# Patient Record
Sex: Male | Born: 1970 | Race: White | Hispanic: No | Marital: Married | Smoking: Former smoker
Health system: Southern US, Community
[De-identification: ages and names within clinical notes are randomized; demographics above are authoritative.]

## PROBLEM LIST (undated history)

## (undated) DIAGNOSIS — E039 Hypothyroidism, unspecified: Secondary | ICD-10-CM

## (undated) DIAGNOSIS — K219 Gastro-esophageal reflux disease without esophagitis: Secondary | ICD-10-CM

## (undated) DIAGNOSIS — E079 Disorder of thyroid, unspecified: Secondary | ICD-10-CM

## (undated) HISTORY — DX: Disorder of thyroid, unspecified: E07.9

## (undated) HISTORY — PX: WISDOM TOOTH EXTRACTION: SHX21

## (undated) HISTORY — DX: Gastro-esophageal reflux disease without esophagitis: K21.9

---

## 2002-02-15 ENCOUNTER — Encounter: Payer: Self-pay | Admitting: Internal Medicine

## 2002-02-15 ENCOUNTER — Encounter: Admission: RE | Admit: 2002-02-15 | Discharge: 2002-02-15 | Payer: Self-pay | Admitting: Internal Medicine

## 2002-02-22 ENCOUNTER — Encounter: Payer: Self-pay | Admitting: Internal Medicine

## 2002-02-22 ENCOUNTER — Ambulatory Visit (HOSPITAL_COMMUNITY): Admission: RE | Admit: 2002-02-22 | Discharge: 2002-02-22 | Payer: Self-pay | Admitting: Internal Medicine

## 2002-03-05 ENCOUNTER — Ambulatory Visit (HOSPITAL_COMMUNITY): Admission: RE | Admit: 2002-03-05 | Discharge: 2002-03-05 | Payer: Self-pay | Admitting: Internal Medicine

## 2002-03-05 ENCOUNTER — Encounter: Payer: Self-pay | Admitting: Internal Medicine

## 2005-01-14 HISTORY — PX: AMPUTATION: SHX166

## 2005-09-04 ENCOUNTER — Emergency Department (HOSPITAL_COMMUNITY): Admission: EM | Admit: 2005-09-04 | Discharge: 2005-09-04 | Payer: Self-pay | Admitting: Emergency Medicine

## 2007-06-26 ENCOUNTER — Encounter: Admission: RE | Admit: 2007-06-26 | Discharge: 2007-06-26 | Payer: Self-pay | Admitting: Internal Medicine

## 2007-06-27 ENCOUNTER — Encounter: Admission: RE | Admit: 2007-06-27 | Discharge: 2007-06-27 | Payer: Self-pay | Admitting: Internal Medicine

## 2009-05-25 IMAGING — CR DG ORBITS FOR FOREIGN BODY
2 series · 2 of 2 positions shown · non-contrast
Comparison: None

CLINICAL DATA: Metal exposure, pre MRI

ORBITS FOR FOREIGN BODY - 2 VIEW

[view not recorded (1 of 2)]
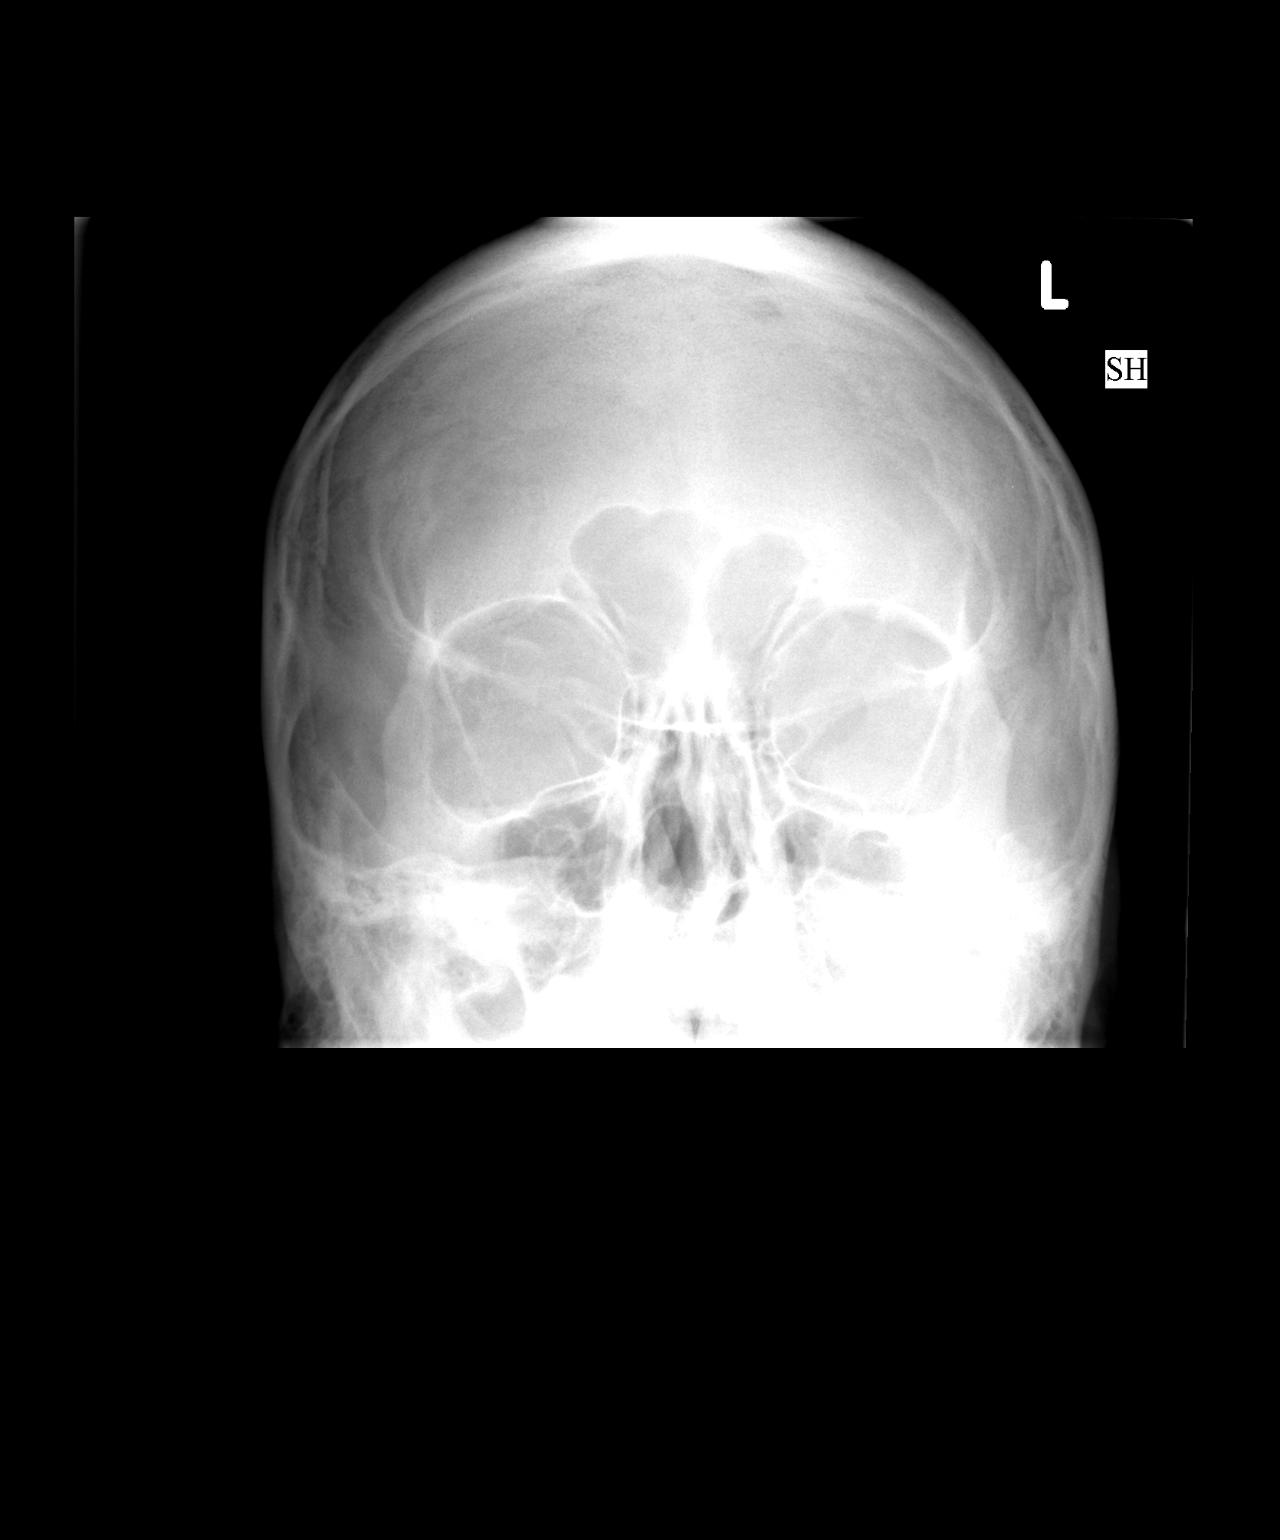

[view not recorded (2 of 2)]
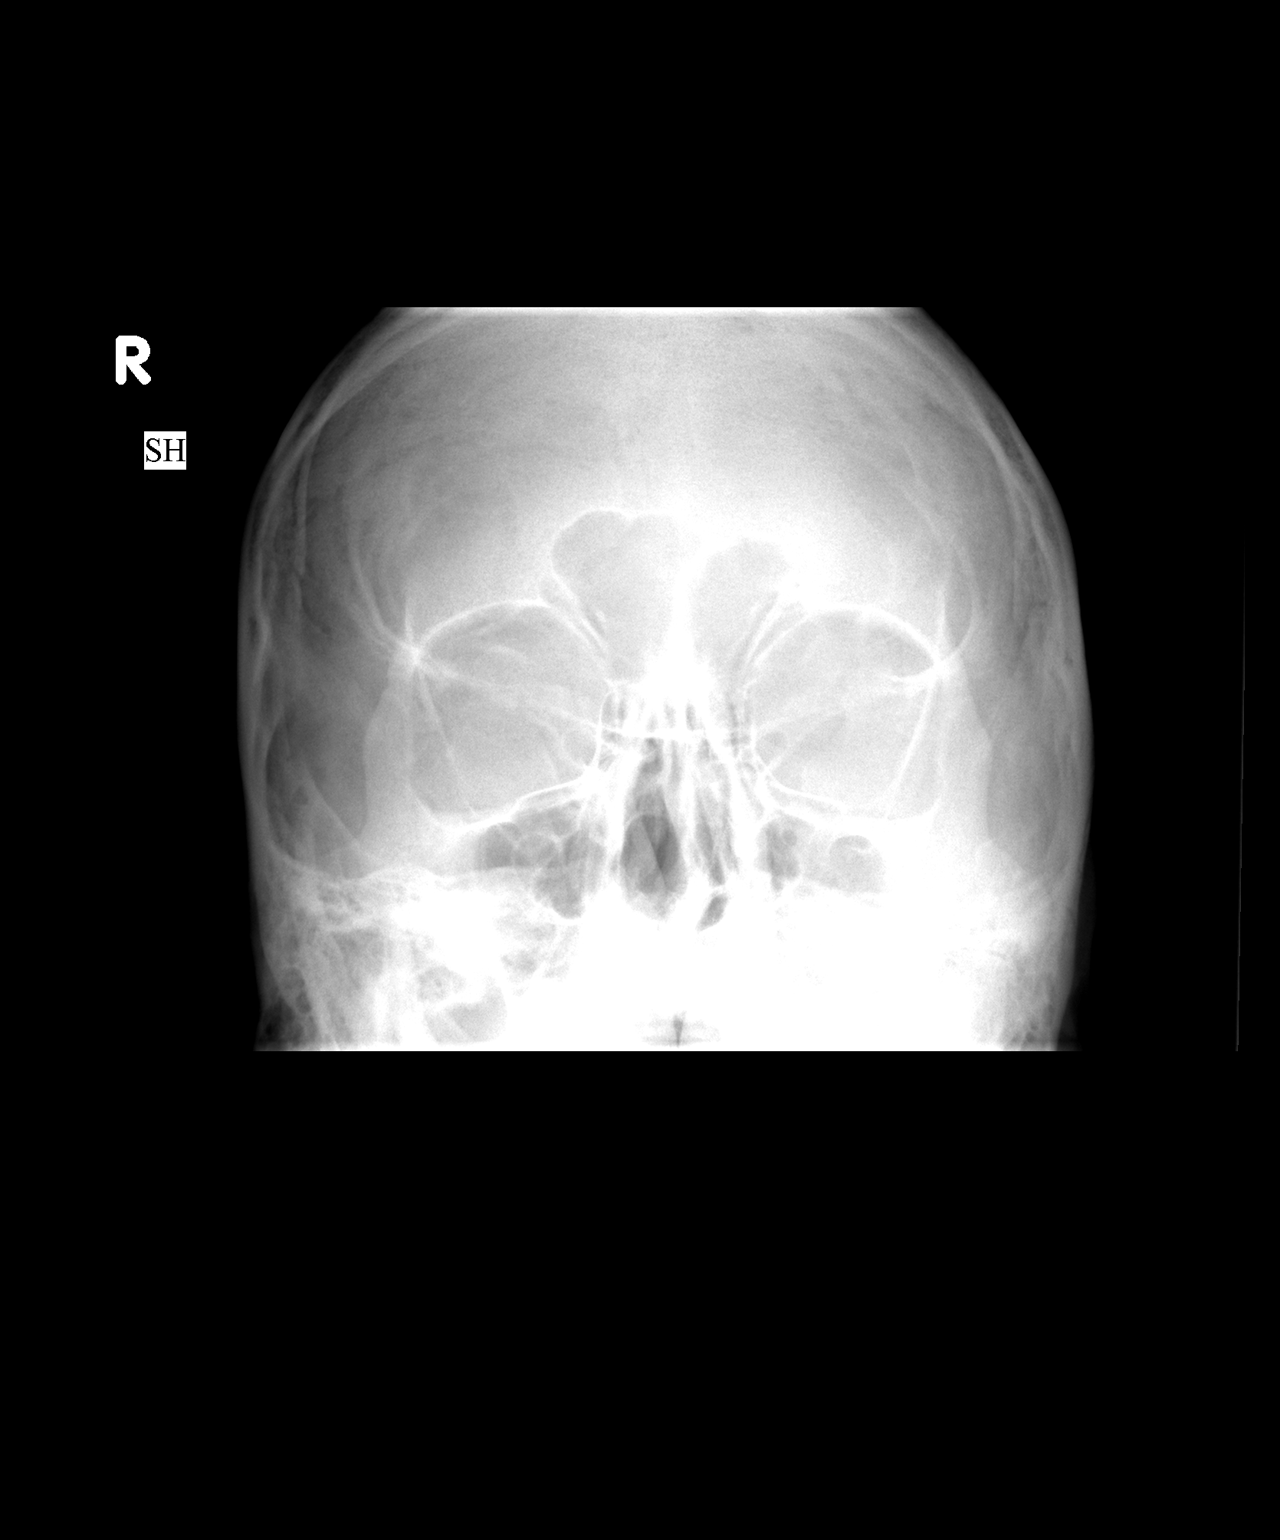

[2 of 2 positions shown; findings below may reference images not displayed]

FINDINGS: Views of the orbits were obtained with the patient
looking to the left and looking to the right.  No metallic orbital
foreign body is seen.  No bony abnormality is noted.
IMPRESSION: No orbital metallic foreign body.

## 2010-01-03 ENCOUNTER — Encounter
Admission: RE | Admit: 2010-01-03 | Discharge: 2010-01-03 | Payer: Self-pay | Source: Home / Self Care | Attending: Occupational Medicine | Admitting: Occupational Medicine

## 2010-06-01 NOTE — Consult Note (Signed)
Bradley Clark, Bradley Clark                 ACCOUNT NO.:  [id number]   MEDICAL RECORD NO.:  [id number]          PATIENT TYPE:  EMS   LOCATION:  [facility]                         FACILITY:  [facility]   PHYSICIAN:  Artist Pais. Weingold, M.D.DATE OF BIRTH:  01/11/1971   DATE OF CONSULTATION:  DATE OF DISCHARGE:  09/04/2005                                   CONSULTATION   REASON FOR CONSULTATION:  Bradley Clark is a 40 year old male, right hand  dominant, who unfortunately had a traumatic injury to his left long finger  that presented to the emergency room with a mutilating injury, with the  level of the middle phalanx, just distal to the proximal phalangeal joint.  He is 40 years old, he is 6 feet tall, he is 180 pounds.  __________swelling.  He has no known drug allergies.  He is not diabetic.  He __________ , currently taking medication, __________ surgery.  He denies  any significant drug or alcohol use.  Exam revealed a traumatic amputation.  The amputated part was significantly crushed.  There was signs of evulsion  of the neurovascular bundles both radially and digitally.  There is no sign  of any other injury.  X-ray showed fractures to the proximal aspect of the  middle phalanx.  He was given a digital block.  He was then prepped in the  usual sterile fashion.  In the emergency department, a revision amputation  with a V left flap was fashioned, including skeletal shortening.  This was  done under local anesthesia in the emergency room followed by 4-0 nylon  closure.  He was discharged from the emergency department with Percocet for  pain, Keflex for antibiotic prophylaxis.  He will follow up in my office in  5-7 days.      Artist Pais Mina Marble, M.D.  Electronically Signed     MAW/MEDQ  D:  10/28/2005  T:  10/29/2005  Job:  [id number]

## 2011-03-19 ENCOUNTER — Emergency Department (HOSPITAL_COMMUNITY)
Admission: EM | Admit: 2011-03-19 | Discharge: 2011-03-19 | Discharge status: Against Medical Advice | Payer: Self-pay | Attending: Emergency Medicine | Admitting: Emergency Medicine

## 2011-03-19 DIAGNOSIS — Z2089 Contact with and (suspected) exposure to other communicable diseases: Secondary | ICD-10-CM | POA: Insufficient documentation

## 2012-12-01 ENCOUNTER — Other Ambulatory Visit: Payer: Self-pay | Admitting: Endocrinology

## 2012-12-01 DIAGNOSIS — R1310 Dysphagia, unspecified: Secondary | ICD-10-CM

## 2012-12-02 ENCOUNTER — Encounter (INDEPENDENT_AMBULATORY_CARE_PROVIDER_SITE_OTHER): Payer: Self-pay | Admitting: Surgery

## 2012-12-03 ENCOUNTER — Ambulatory Visit
Admission: RE | Admit: 2012-12-03 | Discharge: 2012-12-03 | Disposition: A | Discharge status: Home / Self Care | Payer: 59 | Source: Ambulatory Visit | Attending: Endocrinology | Admitting: Endocrinology

## 2012-12-03 DIAGNOSIS — R1310 Dysphagia, unspecified: Secondary | ICD-10-CM

## 2012-12-14 ENCOUNTER — Ambulatory Visit (INDEPENDENT_AMBULATORY_CARE_PROVIDER_SITE_OTHER): Payer: 59 | Admitting: Surgery

## 2012-12-14 ENCOUNTER — Encounter (INDEPENDENT_AMBULATORY_CARE_PROVIDER_SITE_OTHER): Payer: Self-pay | Admitting: Surgery

## 2012-12-14 VITALS — BP 118/76 | HR 74 | Temp 97.2°F | Resp 14 | Ht 72.0 in | Wt 184.6 lb

## 2012-12-14 DIAGNOSIS — D1739 Benign lipomatous neoplasm of skin and subcutaneous tissue of other sites: Secondary | ICD-10-CM

## 2012-12-14 DIAGNOSIS — D171 Benign lipomatous neoplasm of skin and subcutaneous tissue of trunk: Secondary | ICD-10-CM

## 2012-12-14 NOTE — Progress Notes (Signed)
Patient ID: Bradley Clark, male   DOB: 06/11/1970, 42 y.o.   MRN: [id number]  Chief Complaint  Patient presents with  . New Evaluation    eval lipoma on back and buttocks    HPI Bradley Clark is a 42 y.o. male.  Referred by Dr. Dennis Clark for evaluation of subcutaneous masses of the lower back and thigh  HPI This is a healthy 42-year-old male who presents with several months of palpable masses in his lower back causing some discomfort. These have been enlarging slightly. They have never become inflamed or infected. They do cause some chronic discomfort in this area. Over the last few weeks he has developed another one of these masses in the posterior upper medial thigh near his buttocks. This has become fairly tender because he sits directly on this area. He is now referred for surgical evaluation.  Past Medical History  Diagnosis Date  . GERD (gastroesophageal reflux disease)   . Thyroid disease     History reviewed. No pertinent past surgical history.  History reviewed. No pertinent family history.  Social History History  Substance Use Topics  . Smoking status: Former Smoker    Types: Cigarettes    Quit date: 12/14/1997  . Smokeless tobacco: Former User  . 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  No Known Allergies  Current Outpatient Prescriptions  Medication Sig Dispense Refill  . levothyroxine (SYNTHROID, LEVOTHROID) 175 MCG tablet Take 175 mcg by mouth daily before breakfast.       No current facility-administered medications for this visit.    Review of Systems Review of Systems  Constitutional: Negative for fever, chills and unexpected weight change.  HENT: Negative for congestion, hearing loss, sore throat, trouble swallowing and voice change.   Eyes: Negative for visual disturbance.  Respiratory: Negative for cough and wheezing.   Cardiovascular: Negative for chest pain, palpitations and leg swelling.  Gastrointestinal: Negative for  nausea, vomiting, abdominal pain, diarrhea, constipation, blood in stool, abdominal distention, anal bleeding and rectal pain.  Genitourinary: Negative for hematuria and difficulty urinating.  Musculoskeletal: Negative for arthralgias.  Skin: Negative for rash and wound.  Neurological: Negative for seizures, syncope, weakness and headaches.  Hematological: Negative for adenopathy. Does not bruise/bleed easily.  Psychiatric/Behavioral: Negative for confusion.    Blood pressure 118/76, pulse 74, temperature 97.2 F (36.2 C), temperature source Temporal, resp. rate 14, height 6' (1.829 m), weight 184 lb 9.6 oz (83.734 kg).  Physical Exam Physical Exam Well-developed well-nourished male in no apparent distress The lower back shows no visible skin changes. On deep palpation in the lumbar region just over the paraspinal muscles there are 2 palpable masses on the right and 1 palpable mass on the left. Each of these seems to be smooth and oval shaped, measuring about 2 cm across.  On the posterior upper left thigh medially, there is a 2 cm firm smooth palpable mass. 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 There is 1 on the posterior left thigh. Each of these measures about 2 cm.     Plan    Excision of all of these under anesthesia for biopsy. He seemed to be enlarging rather quickly and causing some discomfort.The surgical procedure has been discussed with the patient.  Potential risks, benefits, alternative treatments, and expected outcomes have been   explained.  All of the patient's questions at this time have been answered.  The likelihood of reaching the patient's treatment goal is good.  The patient understand the proposed surgical procedure and wishes to proceed.         Bradley Clark K. 12/14/2012, 10:06 AM    

## 2012-12-21 ENCOUNTER — Encounter (HOSPITAL_BASED_OUTPATIENT_CLINIC_OR_DEPARTMENT_OTHER): Payer: Self-pay | Admitting: *Deleted

## 2012-12-21 NOTE — Progress Notes (Signed)
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

## 2012-12-24 ENCOUNTER — Ambulatory Visit (HOSPITAL_BASED_OUTPATIENT_CLINIC_OR_DEPARTMENT_OTHER)
Admission: RE | Admit: 2012-12-24 | Discharge: 2012-12-24 | Disposition: A | Discharge status: Home / Self Care | Payer: 59 | Source: Ambulatory Visit | Attending: Surgery | Admitting: Surgery

## 2012-12-24 ENCOUNTER — Encounter (HOSPITAL_BASED_OUTPATIENT_CLINIC_OR_DEPARTMENT_OTHER): Payer: Self-pay | Admitting: *Deleted

## 2012-12-24 ENCOUNTER — Ambulatory Visit (HOSPITAL_BASED_OUTPATIENT_CLINIC_OR_DEPARTMENT_OTHER): Payer: 59 | Admitting: Anesthesiology

## 2012-12-24 ENCOUNTER — Encounter (HOSPITAL_BASED_OUTPATIENT_CLINIC_OR_DEPARTMENT_OTHER)
Admission: RE | Disposition: A | Discharge status: Home / Self Care | Payer: Self-pay | Source: Ambulatory Visit | Attending: Surgery

## 2012-12-24 ENCOUNTER — Encounter (HOSPITAL_BASED_OUTPATIENT_CLINIC_OR_DEPARTMENT_OTHER): Payer: 59 | Admitting: Anesthesiology

## 2012-12-24 DIAGNOSIS — D1739 Benign lipomatous neoplasm of skin and subcutaneous tissue of other sites: Secondary | ICD-10-CM

## 2012-12-24 DIAGNOSIS — E079 Disorder of thyroid, unspecified: Secondary | ICD-10-CM | POA: Insufficient documentation

## 2012-12-24 DIAGNOSIS — Z79899 Other long term (current) drug therapy: Secondary | ICD-10-CM | POA: Insufficient documentation

## 2012-12-24 DIAGNOSIS — D171 Benign lipomatous neoplasm of skin and subcutaneous tissue of trunk: Secondary | ICD-10-CM

## 2012-12-24 DIAGNOSIS — Z87891 Personal history of nicotine dependence: Secondary | ICD-10-CM | POA: Insufficient documentation

## 2012-12-24 DIAGNOSIS — K219 Gastro-esophageal reflux disease without esophagitis: Secondary | ICD-10-CM | POA: Insufficient documentation

## 2012-12-24 HISTORY — DX: Hypothyroidism, unspecified: E03.9

## 2012-12-24 HISTORY — PX: LIPOMA EXCISION: SHX5283

## 2012-12-24 SURGERY — EXCISION LIPOMA
Anesthesia: General

## 2012-12-24 MED ORDER — FENTANYL CITRATE 0.05 MG/ML IJ SOLN
INTRAMUSCULAR | Status: DC | PRN
  Administered 2012-12-24: 100 ug via INTRAVENOUS

## 2012-12-24 MED ORDER — DEXAMETHASONE SODIUM PHOSPHATE 4 MG/ML IJ SOLN
INTRAMUSCULAR | Status: DC | PRN
  Administered 2012-12-24: 10 mg via INTRAVENOUS

## 2012-12-24 MED ORDER — CEFAZOLIN SODIUM-DEXTROSE 2-3 GM-% IV SOLR
INTRAVENOUS | Status: AC
  Filled 2012-12-24: qty 50

## 2012-12-24 MED ORDER — CHLORHEXIDINE GLUCONATE 4 % EX LIQD: 1.0000 "application " | Freq: Once | CUTANEOUS | Status: DC

## 2012-12-24 MED ORDER — MIDAZOLAM HCL 2 MG/2ML IJ SOLN
INTRAMUSCULAR | Status: AC
  Filled 2012-12-24: qty 2

## 2012-12-24 MED ORDER — ONDANSETRON HCL 4 MG/2ML IJ SOLN
INTRAMUSCULAR | Status: DC | PRN
  Administered 2012-12-24: 4 mg via INTRAVENOUS

## 2012-12-24 MED ORDER — OXYCODONE HCL 5 MG/5ML PO SOLN: 5.0000 mg | Freq: Once | ORAL | Status: DC | PRN

## 2012-12-24 MED ORDER — HYDROCODONE-ACETAMINOPHEN 5-325 MG PO TABS: 1.0000 | ORAL_TABLET | ORAL | Status: AC | PRN

## 2012-12-24 MED ORDER — LIDOCAINE HCL (CARDIAC) 20 MG/ML IV SOLN
INTRAVENOUS | Status: DC | PRN
  Administered 2012-12-24: 100 mg via INTRAVENOUS

## 2012-12-24 MED ORDER — CEFAZOLIN SODIUM-DEXTROSE 2-3 GM-% IV SOLR: 2.0000 g | INTRAVENOUS | Status: DC

## 2012-12-24 MED ORDER — ONDANSETRON HCL 4 MG/2ML IJ SOLN: 4.0000 mg | INTRAMUSCULAR | Status: DC | PRN

## 2012-12-24 MED ORDER — SUCCINYLCHOLINE CHLORIDE 20 MG/ML IJ SOLN
INTRAMUSCULAR | Status: DC | PRN
  Administered 2012-12-24: 100 mg via INTRAVENOUS

## 2012-12-24 MED ORDER — MIDAZOLAM HCL 5 MG/5ML IJ SOLN
INTRAMUSCULAR | Status: DC | PRN
  Administered 2012-12-24: 2 mg via INTRAVENOUS

## 2012-12-24 MED ORDER — OXYCODONE HCL 5 MG PO TABS: 5.0000 mg | ORAL_TABLET | Freq: Once | ORAL | Status: DC | PRN

## 2012-12-24 MED ORDER — BUPIVACAINE-EPINEPHRINE 0.25% -1:200000 IJ SOLN
INTRAMUSCULAR | Status: DC | PRN
  Administered 2012-12-24: 12.5 mL

## 2012-12-24 MED ORDER — MORPHINE SULFATE 2 MG/ML IJ SOLN: 2.0000 mg | INTRAMUSCULAR | Status: DC | PRN

## 2012-12-24 MED ORDER — HYDROMORPHONE HCL PF 1 MG/ML IJ SOLN: 0.2500 mg | INTRAMUSCULAR | Status: DC | PRN

## 2012-12-24 MED ORDER — FENTANYL CITRATE 0.05 MG/ML IJ SOLN
INTRAMUSCULAR | Status: AC
  Filled 2012-12-24: qty 6

## 2012-12-24 MED ORDER — METOCLOPRAMIDE HCL 5 MG/ML IJ SOLN: 10.0000 mg | Freq: Once | INTRAMUSCULAR | Status: DC | PRN

## 2012-12-24 MED ORDER — LACTATED RINGERS IV SOLN
INTRAVENOUS | Status: DC
  Administered 2012-12-24: 12:00:00 via INTRAVENOUS

## 2012-12-24 MED ORDER — FENTANYL CITRATE 0.05 MG/ML IJ SOLN: 50.0000 ug | INTRAMUSCULAR | Status: DC | PRN

## 2012-12-24 MED ORDER — HYDROCODONE-ACETAMINOPHEN 5-325 MG PO TABS: 1.0000 | ORAL_TABLET | ORAL | Status: DC | PRN

## 2012-12-24 MED ORDER — KETOROLAC TROMETHAMINE 30 MG/ML IJ SOLN
INTRAMUSCULAR | Status: DC | PRN
  Administered 2012-12-24: 30 mg via INTRAVENOUS

## 2012-12-24 MED ORDER — PROPOFOL 10 MG/ML IV BOLUS
INTRAVENOUS | Status: DC | PRN
  Administered 2012-12-24: 300 mg via INTRAVENOUS

## 2012-12-24 MED ORDER — BUPIVACAINE-EPINEPHRINE PF 0.25-1:200000 % IJ SOLN
INTRAMUSCULAR | Status: AC
  Filled 2012-12-24: qty 30

## 2012-12-24 MED ORDER — MIDAZOLAM HCL 2 MG/2ML IJ SOLN: 1.0000 mg | INTRAMUSCULAR | Status: DC | PRN

## 2012-12-24 SURGICAL SUPPLY — 41 items
APL SKNCLS STERI-STRIP NONHPOA (GAUZE/BANDAGES/DRESSINGS) ×2
BENZOIN TINCTURE PRP APPL 2/3 (GAUZE/BANDAGES/DRESSINGS) ×4 IMPLANT
BLADE SURG 15 STRL LF DISP TIS (BLADE) ×2 IMPLANT
BLADE SURG 15 STRL SS (BLADE) ×4
BLADE SURG ROTATE 9660 (MISCELLANEOUS) ×2 IMPLANT
CANISTER SUCT 1200ML W/VALVE (MISCELLANEOUS) IMPLANT
CHLORAPREP W/TINT 26ML (MISCELLANEOUS) ×4 IMPLANT
COVER MAYO STAND STRL (DRAPES) ×2 IMPLANT
COVER TABLE BACK 60X90 (DRAPES) ×2 IMPLANT
DECANTER SPIKE VIAL GLASS SM (MISCELLANEOUS) IMPLANT
DRAPE PED LAPAROTOMY (DRAPES) ×2 IMPLANT
DRAPE UTILITY XL STRL (DRAPES) ×2 IMPLANT
DRSG TEGADERM 4X4.75 (GAUZE/BANDAGES/DRESSINGS) ×8 IMPLANT
ELECT COATED BLADE 2.86 ST (ELECTRODE) ×2 IMPLANT
ELECT REM PT RETURN 9FT ADLT (ELECTROSURGICAL) ×2
ELECTRODE REM PT RTRN 9FT ADLT (ELECTROSURGICAL) ×1 IMPLANT
GAUZE SPONGE 4X4 12PLY STRL LF (GAUZE/BANDAGES/DRESSINGS) IMPLANT
GLOVE BIO SURGEON STRL SZ7 (GLOVE) ×2 IMPLANT
GLOVE BIOGEL PI IND STRL 7.0 (GLOVE) ×1 IMPLANT
GLOVE BIOGEL PI IND STRL 7.5 (GLOVE) ×1 IMPLANT
GLOVE BIOGEL PI INDICATOR 7.0 (GLOVE) ×1
GLOVE BIOGEL PI INDICATOR 7.5 (GLOVE) ×1
GLOVE SURG SS PI 7.0 STRL IVOR (GLOVE) ×2 IMPLANT
GOWN PREVENTION PLUS XLARGE (GOWN DISPOSABLE) ×4 IMPLANT
NEEDLE HYPO 25X1 1.5 SAFETY (NEEDLE) ×2 IMPLANT
NS IRRIG 1000ML POUR BTL (IV SOLUTION) IMPLANT
PACK BASIN DAY SURGERY FS (CUSTOM PROCEDURE TRAY) ×2 IMPLANT
PENCIL BUTTON HOLSTER BLD 10FT (ELECTRODE) ×2 IMPLANT
SPONGE GAUZE 2X2 8PLY STRL LF (GAUZE/BANDAGES/DRESSINGS) ×6 IMPLANT
STRIP CLOSURE SKIN 1/2X4 (GAUZE/BANDAGES/DRESSINGS) ×2 IMPLANT
SUT MON AB 4-0 PC3 18 (SUTURE) ×4 IMPLANT
SUT PROLENE 6 0 P 1 18 (SUTURE) IMPLANT
SUT SILK 2 0 FS (SUTURE) IMPLANT
SUT VIC AB 3-0 SH 27 (SUTURE) ×4
SUT VIC AB 3-0 SH 27X BRD (SUTURE) ×2 IMPLANT
SUT VICRYL 3-0 CR8 SH (SUTURE) IMPLANT
SYR CONTROL 10ML LL (SYRINGE) ×2 IMPLANT
TOWEL OR 17X24 6PK STRL BLUE (TOWEL DISPOSABLE) ×2 IMPLANT
TOWEL OR NON WOVEN STRL DISP B (DISPOSABLE) ×2 IMPLANT
TUBE CONNECTING 20X1/4 (TUBING) IMPLANT
YANKAUER SUCT BULB TIP NO VENT (SUCTIONS) IMPLANT

## 2012-12-24 NOTE — Anesthesia Postprocedure Evaluation (Signed)
Anesthesia Post Note  Patient: Bradley Clark  Procedure(s) Performed: Procedure(s) (LRB): EXCISION MULTIPLE LIPOMAS LOWER BACK AND POSTERIOR LEFT THIGH (N/A)  Anesthesia type: General  Patient location: PACU  Post pain: Pain level controlled  Post assessment: Patient's Cardiovascular Status Stable  Last Vitals:  Filed Vitals:   12/24/12 1430  BP: 114/79  Pulse: 84  Temp:   Resp: 13    Post vital signs: Reviewed and stable  Level of consciousness: alert  Complications: No apparent anesthesia complications

## 2012-12-24 NOTE — Anesthesia Procedure Notes (Signed)
Procedure Name: Intubation Date/Time: 12/24/2012 12:47 PM Performed by: Zenia Resides D Pre-anesthesia Checklist: Patient identified, Emergency Drugs available, Suction available and Patient being monitored Patient Re-evaluated:Patient Re-evaluated prior to inductionOxygen Delivery Method: Circle System Utilized Preoxygenation: Pre-oxygenation with 100% oxygen Intubation Type: IV induction Ventilation: Mask ventilation without difficulty Laryngoscope Size: Mac and 3 Grade View: Grade I Tube type: Oral Number of attempts: 1 Airway Equipment and Method: stylet and oral airway Placement Confirmation: ETT inserted through vocal cords under direct vision,  positive ETCO2 and breath sounds checked- equal and bilateral Secured at: 23 cm Tube secured with: Tape Dental Injury: Teeth and Oropharynx as per pre-operative assessment

## 2012-12-24 NOTE — Interval H&P Note (Signed)
History and Physical Interval Note:  12/24/2012 12:20 PM  Bradley Clark  has presented today for surgery, with the diagnosis of mulitiple subcutaneous lipomas  The various methods of treatment have been discussed with the patient and family. After consideration of risks, benefits and other options for treatment, the patient has consented to  Procedure(s): EXCISION MULTIPLE LIPOMAS LOWER BACK AND POSTERIOR LEFT THIGH (N/A) as a surgical intervention .  The patient's history has been reviewed, patient examined, no change in status, stable for surgery.  I have reviewed the patient's chart and labs.  Questions were answered to the patient's satisfaction.     Vaiden Adames K.

## 2012-12-24 NOTE — Transfer of Care (Signed)
Immediate Anesthesia Transfer of Care Note  Patient: Bradley Clark  Procedure(s) Performed: Procedure(s): EXCISION MULTIPLE LIPOMAS LOWER BACK AND POSTERIOR LEFT THIGH (N/A)  Patient Location: PACU  Anesthesia Type:General  Level of Consciousness: awake, alert  and oriented  Airway & Oxygen Therapy: Patient Spontanous Breathing and Patient connected to face mask oxygen  Post-op Assessment: Report given to PACU RN and Post -op Vital signs reviewed and stable  Post vital signs: Reviewed and stable  Complications: No apparent anesthesia complications

## 2012-12-24 NOTE — Anesthesia Preprocedure Evaluation (Signed)
Anesthesia Evaluation  Patient identified by MRN, date of birth, ID band Patient awake    Reviewed: Allergy & Precautions, H&P , NPO status , Patient's Chart, lab work & pertinent test results, reviewed documented beta blocker date and time   Airway Mallampati: II TM Distance: >3 FB Neck ROM: full    Dental   Pulmonary neg pulmonary ROS, former smoker,  breath sounds clear to auscultation        Cardiovascular negative cardio ROS  Rhythm:regular     Neuro/Psych negative neurological ROS  negative psych ROS   GI/Hepatic Neg liver ROS, GERD-  Medicated and Controlled,  Endo/Other  Hypothyroidism   Renal/GU negative Renal ROS  negative genitourinary   Musculoskeletal   Abdominal   Peds  Hematology negative hematology ROS (+)   Anesthesia Other Findings See surgeon's H&P   Reproductive/Obstetrics negative OB ROS                           Anesthesia Physical Anesthesia Plan  ASA: II  Anesthesia Plan: General   Post-op Pain Management:    Induction: Intravenous  Airway Management Planned: Oral ETT  Additional Equipment:   Intra-op Plan:   Post-operative Plan: Extubation in OR  Informed Consent: I have reviewed the patients History and Physical, chart, labs and discussed the procedure including the risks, benefits and alternatives for the proposed anesthesia with the patient or authorized representative who has indicated his/her understanding and acceptance.   Dental Advisory Given  Plan Discussed with: CRNA and Surgeon  Anesthesia Plan Comments:         Anesthesia Quick Evaluation

## 2012-12-24 NOTE — Op Note (Signed)
Pre-op Diagnosis:  Subcutaneous masses - lumbar region x 4 and left posterior thigh Post-op Diagnosis:  Same Procedure performed: Excision of subcutaneous masses x5 from the lumbar region and posterior left thigh  (all measured 2 cm or less each) Surgeon:Leonte Horrigan K. Anesthesia: Gen.  Indications: This is a 42 year old male who presents with some lower back pain as well as pain in his left upper thigh. He is palpated some enlarging firm subcutaneous nodules in this area. It is unclear whether these nodules are related to his pain but they seem to be enlarging. He was examined in the office and these were felt to represent lipomas. I offered to excise to see if this relieves his symptoms.  Description of procedure:The patient brought to the operating room and placed in a supine position on the stretcher. After an adequate level of general anesthesia was obtained, he was flipped to a prone position on the table. We shaved and prepped his lower back and draped in sterile fashion. A timeout was taken to ensure the proper patient proper procedure. The patient is to palpable masses on the left and to palpable masses on the right. I made transverse incisions between the 2 masses on the left. We dissected down into the subcutaneous tissues with cautery. I can palpate these for masses and excised these entirely. These seemed to be firm lipomas but this was sent for pathologic examination. The wound was infiltrated with 0.25% Marcaine with epinephrine.We inspected for hemostasis. We then closed with a deep layer of 3-0 Vicryl and a subcuticular layer of 4-0 Monocryl.  We made a similar transverse incision on the right. We dissected down the subcutaneous tissues with cautery and excised 2 more palpable masses. This was sent for pathologic examination. We closed with 3-0 Vicryl and 4-0 Monocryl. Steri-Strips and clean dressings were applied.  We then took down her sterile drapes and reprepped his left posterior  thigh. We infiltrated with 0.25% Marcaine. I made a small transverse incision over this area. We excised the firm nodule in the subcutaneous fat. This was sent for pathologic examination. The wound is closed with 3-0 Vicryl 4 Monocryl and dressed in similar fashion.  The patient was flipped back onto his back and was extubated. He is brought to recovery in stable condition. All sponge, initially, and needle counts are correct.  Wilmon Arms. Corliss Skains, MD, [facility]  General/ Trauma Surgery  12/24/2012 1:59 PM

## 2012-12-24 NOTE — H&P (View-Only) (Signed)
Patient ID: Bradley Clark, male   DOB: [birth year], 42 y.o.   MRN: [id number]  Chief Complaint  Patient presents with  . New Evaluation    eval lipoma on back and buttocks    HPI Bradley Clark is a 42 y.o. male.  Referred by Dr. Adela Lank for evaluation of subcutaneous masses of the lower back and thigh  HPI This is a healthy 42 year old male who presents with several months of palpable masses in his lower back causing some discomfort. These have been enlarging slightly. They have never become inflamed or infected. They do cause some chronic discomfort in this area. Over the last few weeks he has developed another one of these masses in the posterior upper medial thigh near his buttocks. This has become fairly tender because he sits directly on this area. He is now referred for surgical evaluation.  Past Medical History  Diagnosis Date  . GERD (gastroesophageal reflux disease)   . Thyroid disease     History reviewed. No pertinent past surgical history.  History reviewed. No pertinent family history.  Social History History  Substance Use Topics  . Smoking status: Former Smoker    Types: Cigarettes    Quit date: 12/14/1997  . Smokeless tobacco: Former Neurosurgeon  . Alcohol Use: No  The patient works as a Medical laboratory scientific officer.  No Known Allergies  Current Outpatient Prescriptions  Medication Sig Dispense Refill  . levothyroxine (SYNTHROID, LEVOTHROID) 175 MCG tablet Take 175 mcg by mouth daily before breakfast.       No current facility-administered medications for this visit.    Review of Systems Review of Systems  Constitutional: Negative for fever, chills and unexpected weight change.  HENT: Negative for congestion, hearing loss, sore throat, trouble swallowing and voice change.   Eyes: Negative for visual disturbance.  Respiratory: Negative for cough and wheezing.   Cardiovascular: Negative for chest pain, palpitations and leg swelling.  Gastrointestinal: Negative for  nausea, vomiting, abdominal pain, diarrhea, constipation, blood in stool, abdominal distention, anal bleeding and rectal pain.  Genitourinary: Negative for hematuria and difficulty urinating.  Musculoskeletal: Negative for arthralgias.  Skin: Negative for rash and wound.  Neurological: Negative for seizures, syncope, weakness and headaches.  Hematological: Negative for adenopathy. Does not bruise/bleed easily.  Psychiatric/Behavioral: Negative for confusion.    Blood pressure 118/76, pulse 74, temperature 97.2 F (36.2 C), temperature source Temporal, resp. rate 14, height 6' (1.829 m), weight 184 lb 9.6 oz (83.734 kg).  Physical Exam Physical Exam Well-developed well-nourished male in no apparent distress The lower back shows no visible skin changes. On deep palpation in the lumbar region just over the paraspinal muscles there are 2 palpable masses on the right and 1 palpable mass on the left. Each of these seems to be smooth and oval shaped, measuring about 2 cm across.  On the posterior upper left thigh medially, there is a 2 cm firm smooth palpable mass. 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 There is 1 on the posterior left thigh. Each of these measures about 2 cm.     Plan    Excision of all of these under anesthesia for biopsy. He seemed to be enlarging rather quickly and causing some discomfort.The surgical procedure has been discussed with the patient.  Potential risks, benefits, alternative treatments, and expected outcomes have been  explained.  All of the patient's questions at this time have been answered.  The likelihood of reaching the patient's treatment goal is good.  The patient understand the proposed surgical procedure and wishes to proceed.         Torell Minder K. 12/14/2012, 10:06 AM

## 2012-12-25 ENCOUNTER — Encounter (HOSPITAL_BASED_OUTPATIENT_CLINIC_OR_DEPARTMENT_OTHER): Payer: Self-pay | Admitting: Surgery

## 2012-12-25 ENCOUNTER — Telehealth (INDEPENDENT_AMBULATORY_CARE_PROVIDER_SITE_OTHER): Payer: Self-pay | Admitting: General Surgery

## 2012-12-25 NOTE — Telephone Encounter (Signed)
Called patient to let him know that his lipoma are normal no sign of cancer and to keep his apt with Dr Corliss Skains on 01-18-13

## 2012-12-28 ENCOUNTER — Encounter (INDEPENDENT_AMBULATORY_CARE_PROVIDER_SITE_OTHER): Payer: Self-pay | Admitting: General Surgery

## 2013-01-18 ENCOUNTER — Encounter (INDEPENDENT_AMBULATORY_CARE_PROVIDER_SITE_OTHER): Payer: Self-pay | Admitting: Surgery

## 2013-01-18 ENCOUNTER — Ambulatory Visit (INDEPENDENT_AMBULATORY_CARE_PROVIDER_SITE_OTHER): Payer: 59 | Admitting: Surgery

## 2013-01-18 VITALS — BP 124/62 | HR 62 | Temp 98.0°F | Resp 18 | Ht 72.0 in | Wt 183.0 lb

## 2013-01-18 DIAGNOSIS — D1739 Benign lipomatous neoplasm of skin and subcutaneous tissue of other sites: Secondary | ICD-10-CM

## 2013-01-18 DIAGNOSIS — D171 Benign lipomatous neoplasm of skin and subcutaneous tissue of trunk: Secondary | ICD-10-CM

## 2013-01-18 NOTE — Progress Notes (Signed)
The patient returns after excision of multiple subcutaneous lipomas from his lumbar region. This was performed on 12/24/12. Pathology confirmed that these were benign lipomas. The patient's back feels much better. He is no longer having muscle pain that may have been caused by pressure from his lipomas.  His incisions are well-healed with no sign of infection or seroma.  He may resume full activity.  Followup as needed.  Imogene Burn. Georgette Dover, MD, [facility]  General/ Trauma Surgery  01/18/2013 9:02 AM

## 2014-11-02 IMAGING — RF DG ESOPHAGUS
11 of 14 series · 19 of 24 positions shown · non-contrast
Comparison: None.

FLUOROSCOPY TIME:  1 min 42 seconds

CLINICAL DATA: Dysphagia

EXAM:
ESOPHOGRAM / BARIUM SWALLOW / BARIUM TABLET STUDY
TECHNIQUE: Combined double contrast and single contrast examination performed
using effervescent crystals, thick barium liquid, and thin barium
liquid. The patient was observed with fluoroscopy swallowing a 13mm
barium sulphate tablet.

[Series 2: run · 1 of 1 slices shown (1 of 11)]
[im 1/1]
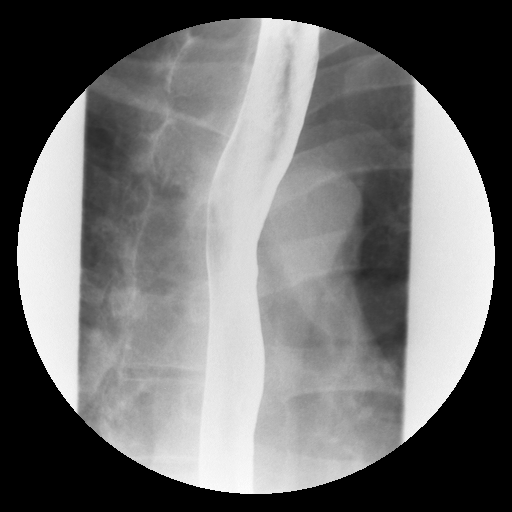

[Series 3: run · 1 of 1 slices shown (2 of 11)]
[im 1/1]
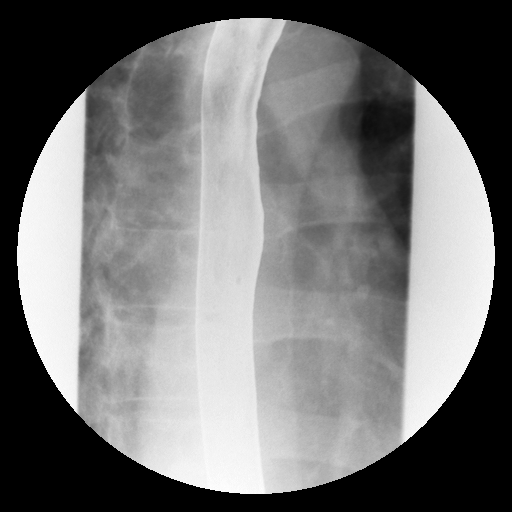

[Series 5: run · 1 of 1 slices shown (3 of 11)]
[im 1/1]
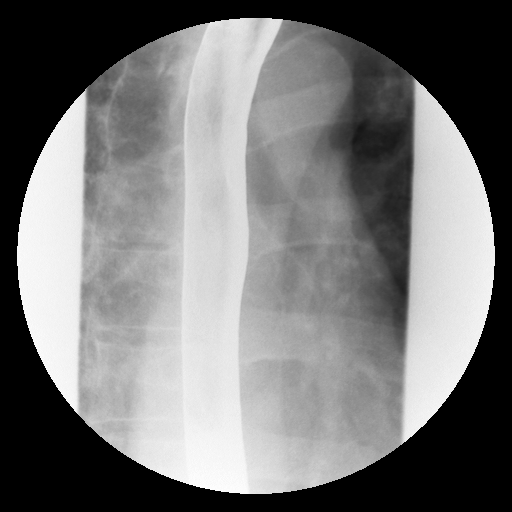

[Series 7: run · 4 of 9 slices shown (4 of 11)]
[im 1/9]
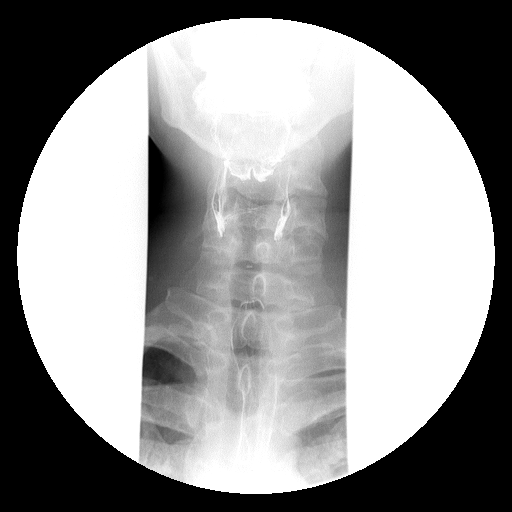
[im 3/9]
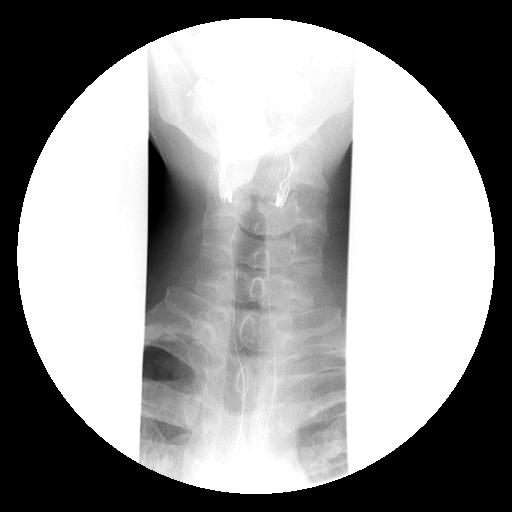
[im 5/9]
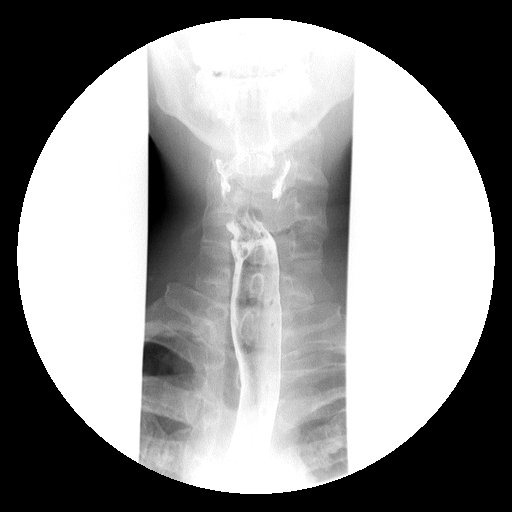
[im 9/9]
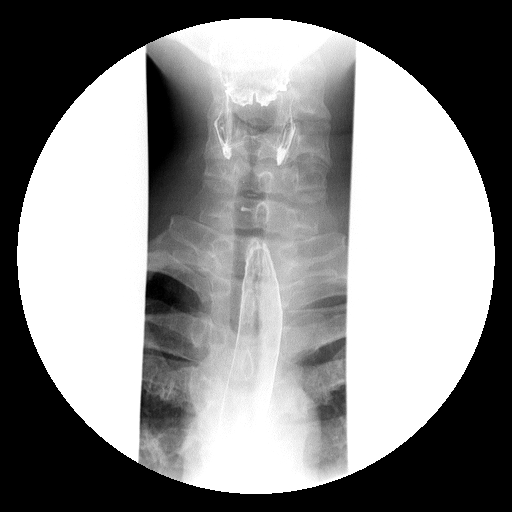

[Series 8: run · 6 of 10 slices shown (5 of 11)]
[im 1/10]
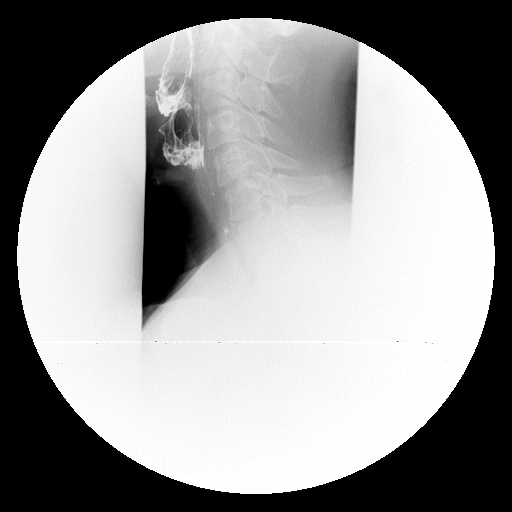
[im 2/10]
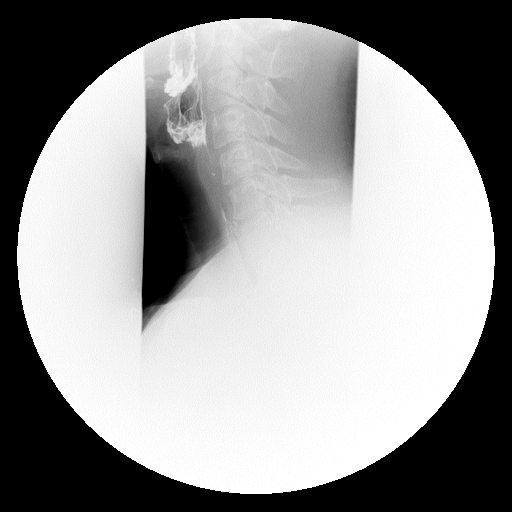
[im 5/10]
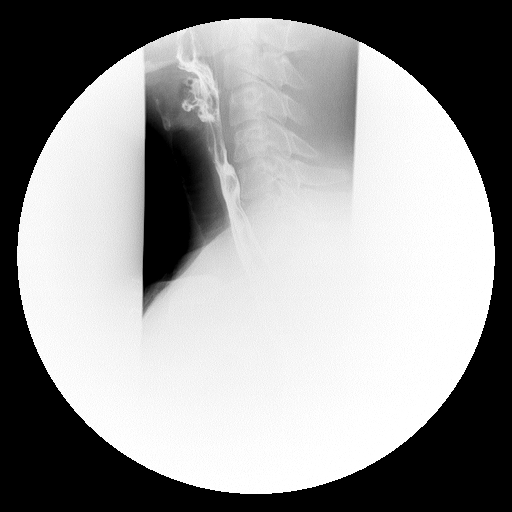
[im 7/10]
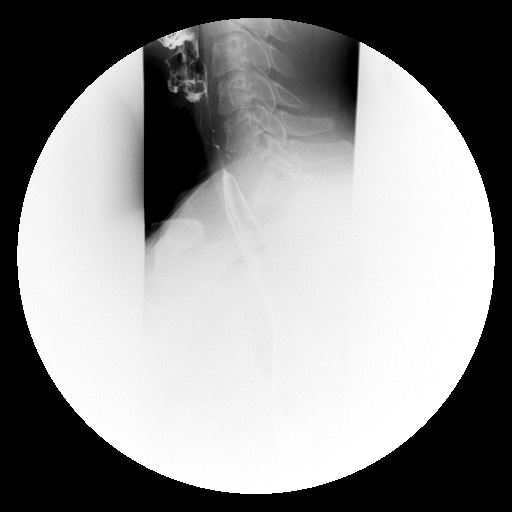
[im 8/10]
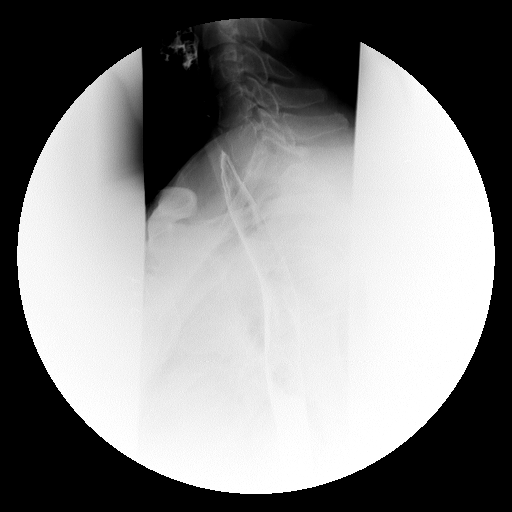
[im 10/10]
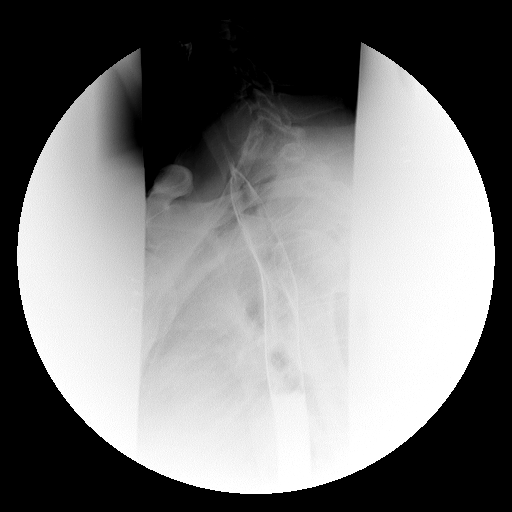

[Series 10: run · 1 of 1 slices shown (6 of 11)]
[im 1/1]
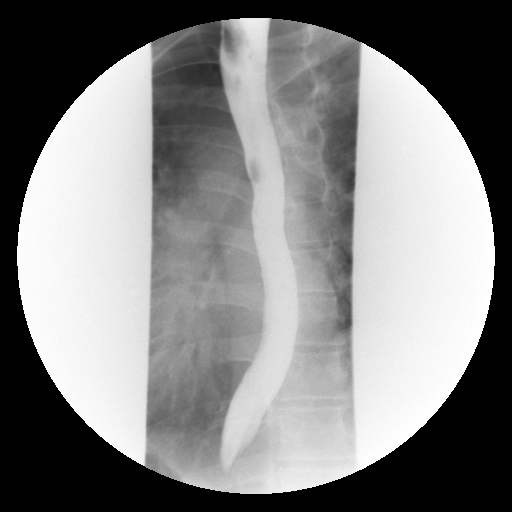

[Series 11: run · 1 of 1 slices shown (7 of 11)]
[im 1/1]
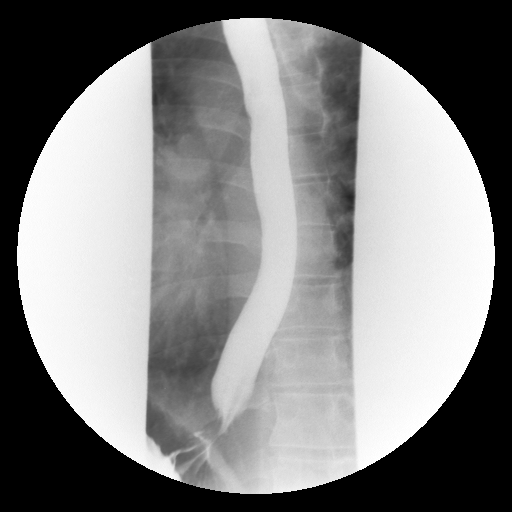

[Series 12: run · 1 of 1 slices shown (8 of 11)]
[im 1/1]
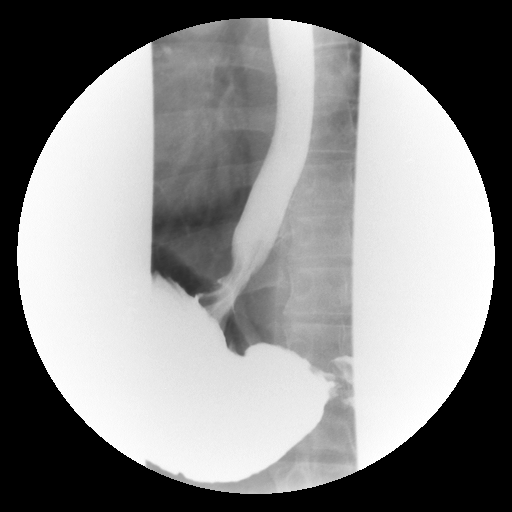

[Series 13: run · 1 of 1 slices shown (9 of 11)]
[im 1/1]
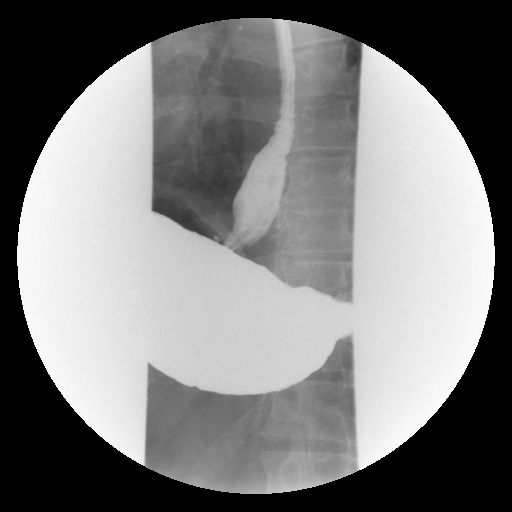

[Series 15: run · 1 of 1 slices shown (10 of 11)]
[im 1/1]
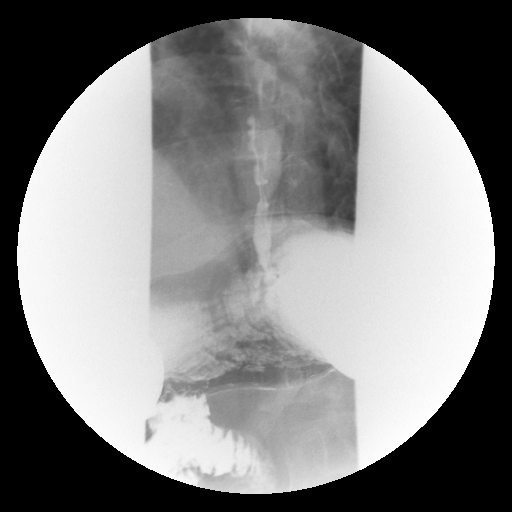

[Series 16: run · 1 of 1 slices shown (11 of 11)]
[im 1/1]
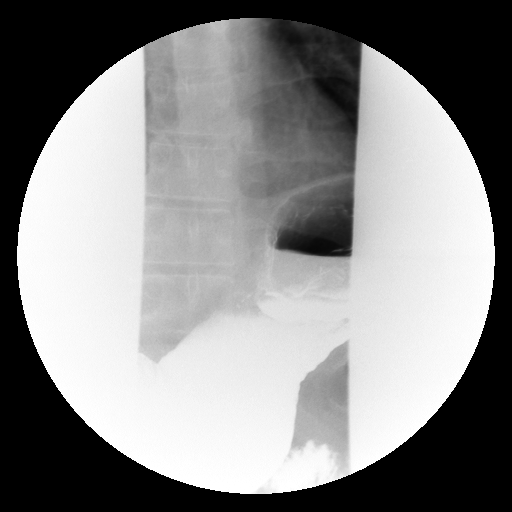

[19 of 24 positions shown; findings below may reference images not displayed]

FINDINGS: A double contrast study was performed. The mucosa of the esophagus
is unremarkable. A single contrast study shows the swallowing
mechanism to be normal. Esophageal peristalsis is normal. No hiatal
hernia is seen. Only faint gastroesophageal reflux is noted. A
barium pill was given at the end of the study which passed into the
stomach without delay.
IMPRESSION: Faint gastroesophageal reflux.

## 2015-02-15 ENCOUNTER — Other Ambulatory Visit (HOSPITAL_COMMUNITY): Payer: Self-pay | Admitting: Urology

## 2015-02-15 DIAGNOSIS — N50811 Right testicular pain: Secondary | ICD-10-CM

## 2015-02-28 ENCOUNTER — Ambulatory Visit (HOSPITAL_COMMUNITY)
Admission: RE | Admit: 2015-02-28 | Discharge: 2015-02-28 | Disposition: A | Discharge status: Home / Self Care | Payer: 59 | Source: Ambulatory Visit | Attending: Urology | Admitting: Urology

## 2015-02-28 DIAGNOSIS — N50811 Right testicular pain: Secondary | ICD-10-CM | POA: Diagnosis not present

## 2015-02-28 DIAGNOSIS — M4806 Spinal stenosis, lumbar region: Secondary | ICD-10-CM | POA: Diagnosis not present

## 2015-02-28 DIAGNOSIS — M5116 Intervertebral disc disorders with radiculopathy, lumbar region: Secondary | ICD-10-CM | POA: Diagnosis not present

## 2015-02-28 DIAGNOSIS — M545 Low back pain: Secondary | ICD-10-CM | POA: Diagnosis present

## 2015-03-20 DIAGNOSIS — M5126 Other intervertebral disc displacement, lumbar region: Secondary | ICD-10-CM | POA: Insufficient documentation

## 2016-02-01 DIAGNOSIS — E291 Testicular hypofunction: Secondary | ICD-10-CM | POA: Diagnosis not present

## 2016-02-01 DIAGNOSIS — E034 Atrophy of thyroid (acquired): Secondary | ICD-10-CM | POA: Diagnosis not present

## 2016-02-08 DIAGNOSIS — E291 Testicular hypofunction: Secondary | ICD-10-CM | POA: Diagnosis not present

## 2016-02-09 ENCOUNTER — Other Ambulatory Visit: Payer: Self-pay | Admitting: Endocrinology

## 2016-02-09 DIAGNOSIS — I899 Noninfective disorder of lymphatic vessels and lymph nodes, unspecified: Secondary | ICD-10-CM

## 2016-02-13 ENCOUNTER — Ambulatory Visit
Admission: RE | Admit: 2016-02-13 | Discharge: 2016-02-13 | Disposition: A | Discharge status: Home / Self Care | Payer: 59 | Source: Ambulatory Visit | Attending: Endocrinology | Admitting: Endocrinology

## 2016-02-13 DIAGNOSIS — I899 Noninfective disorder of lymphatic vessels and lymph nodes, unspecified: Secondary | ICD-10-CM

## 2016-02-13 DIAGNOSIS — E041 Nontoxic single thyroid nodule: Secondary | ICD-10-CM | POA: Diagnosis not present

## 2016-04-09 DIAGNOSIS — E034 Atrophy of thyroid (acquired): Secondary | ICD-10-CM | POA: Diagnosis not present

## 2016-04-09 DIAGNOSIS — E291 Testicular hypofunction: Secondary | ICD-10-CM | POA: Diagnosis not present

## 2016-04-11 DIAGNOSIS — N452 Orchitis: Secondary | ICD-10-CM | POA: Diagnosis not present

## 2016-08-26 DIAGNOSIS — N50811 Right testicular pain: Secondary | ICD-10-CM | POA: Insufficient documentation

## 2016-10-22 DIAGNOSIS — Z01818 Encounter for other preprocedural examination: Secondary | ICD-10-CM | POA: Insufficient documentation

## 2016-10-23 DIAGNOSIS — Z302 Encounter for sterilization: Secondary | ICD-10-CM | POA: Insufficient documentation

## 2016-10-23 DIAGNOSIS — R102 Pelvic and perineal pain: Secondary | ICD-10-CM | POA: Diagnosis not present

## 2016-10-23 DIAGNOSIS — G8929 Other chronic pain: Secondary | ICD-10-CM | POA: Diagnosis not present

## 2016-12-30 DIAGNOSIS — J0100 Acute maxillary sinusitis, unspecified: Secondary | ICD-10-CM | POA: Diagnosis not present

## 2016-12-30 DIAGNOSIS — J209 Acute bronchitis, unspecified: Secondary | ICD-10-CM | POA: Diagnosis not present

## 2017-03-19 IMAGING — US US THYROID
1 series · 14 of 25 positions shown · non-contrast
Comparison: None available

CLINICAL DATA: Previous radio iodine therapy 2332. Thyroid nodule
on physical exam.

EXAM:
THYROID ULTRASOUND
TECHNIQUE: Ultrasound examination of the thyroid gland and adjacent soft
tissues was performed.

[Series 1: us thyroid · 0.04mm/px · 14 of 33 slices shown]
[im 1/33]
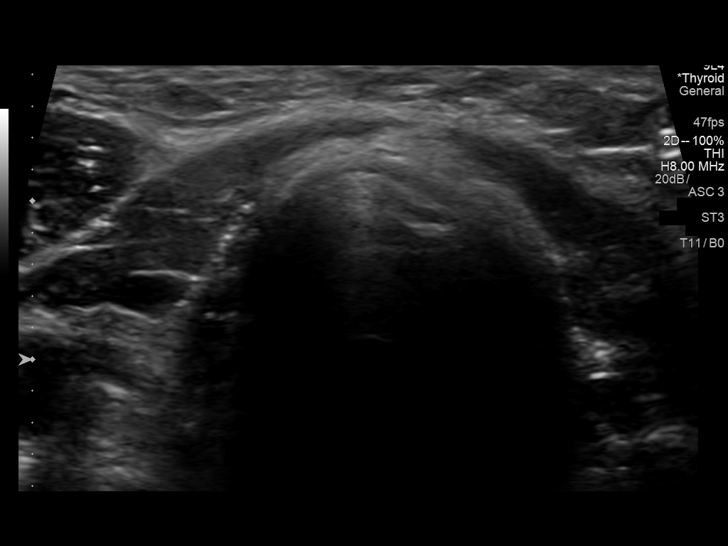
[im 3/33]
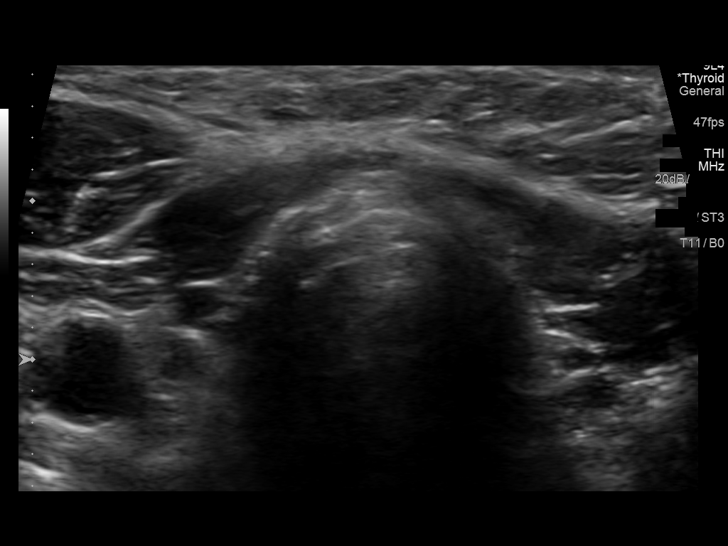
[im 6/33]
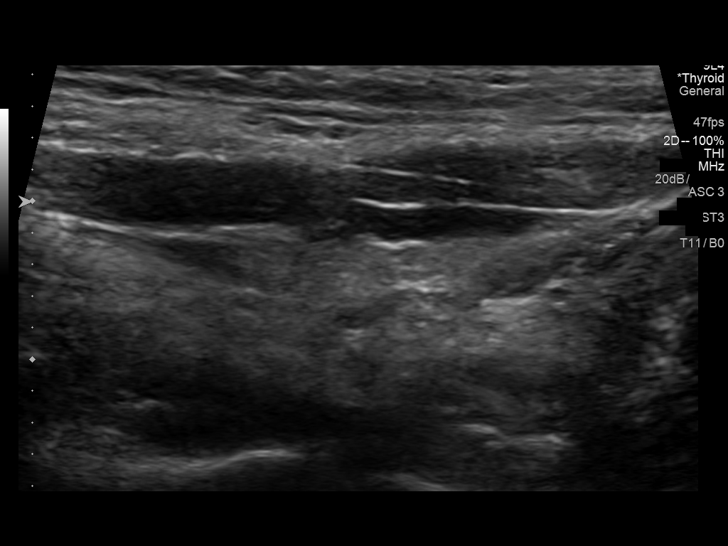
[im 9/33]
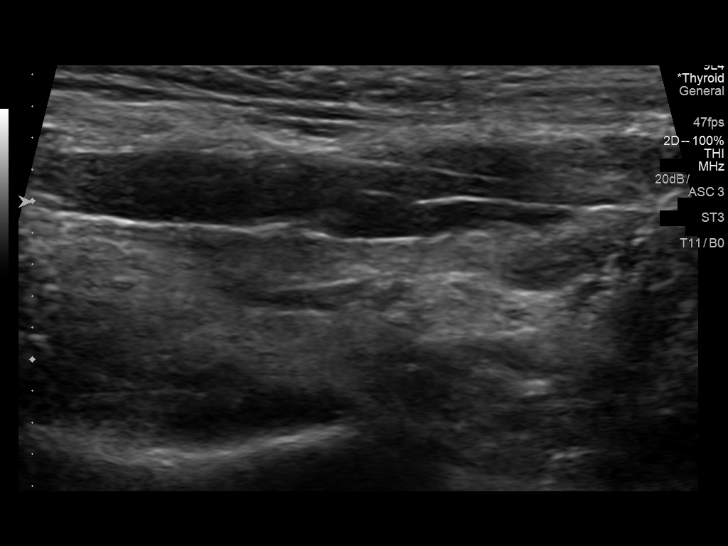
[im 11/33]
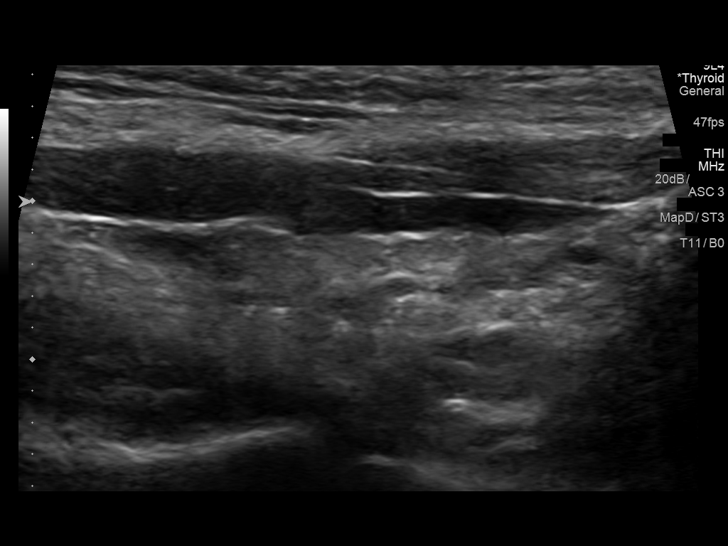
[im 13/33]
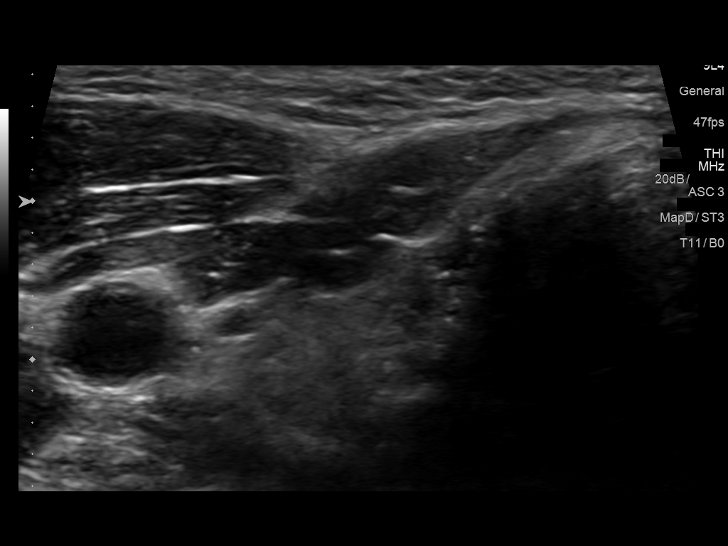
[im 15/33]
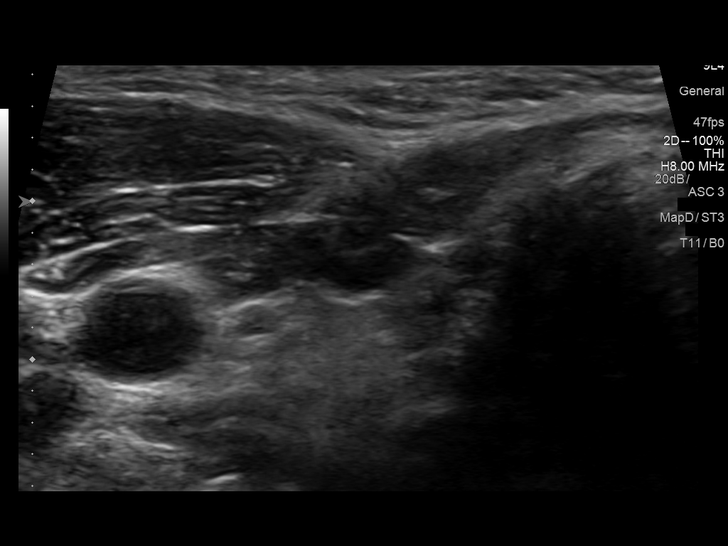
[im 18/33]
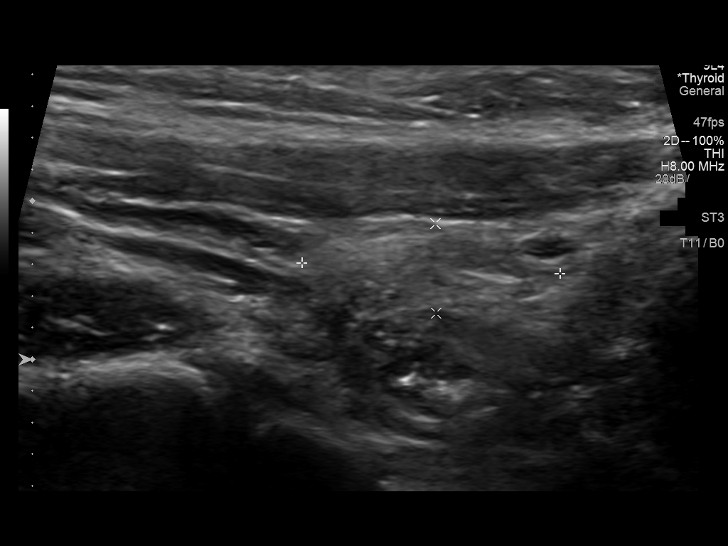
[im 21/33]
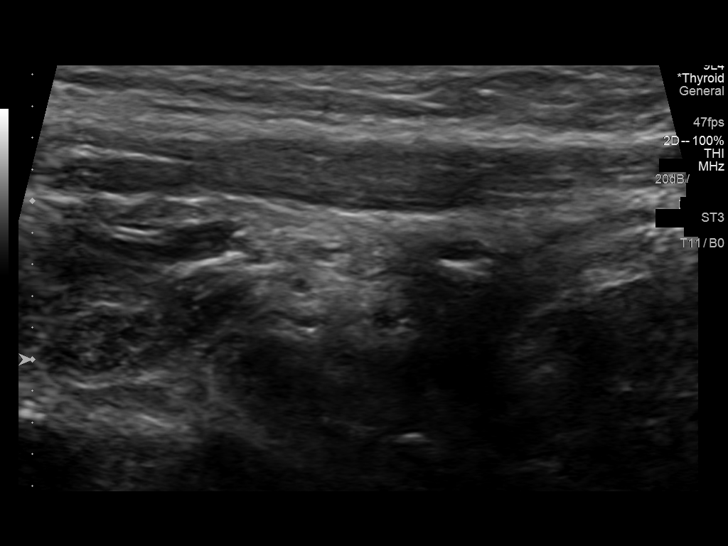
[im 22/33]
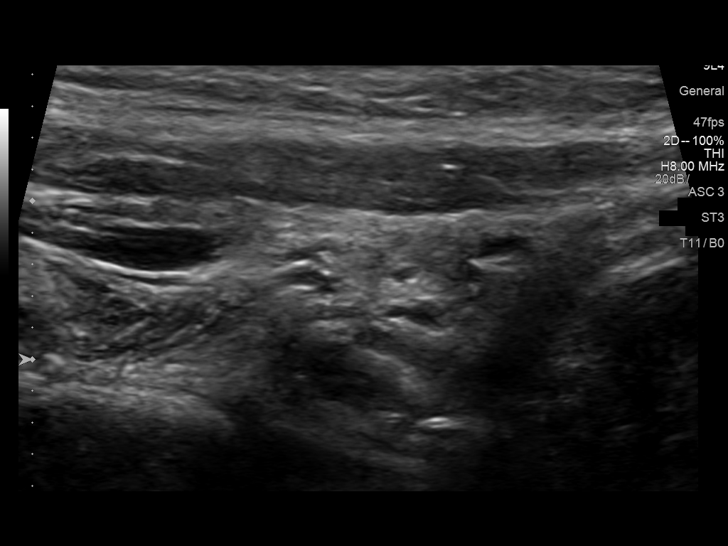
[im 25/33]
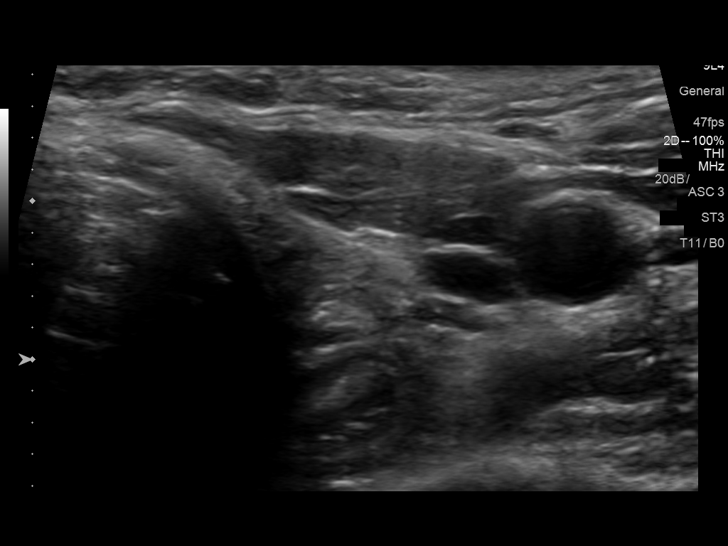
[im 27/33]
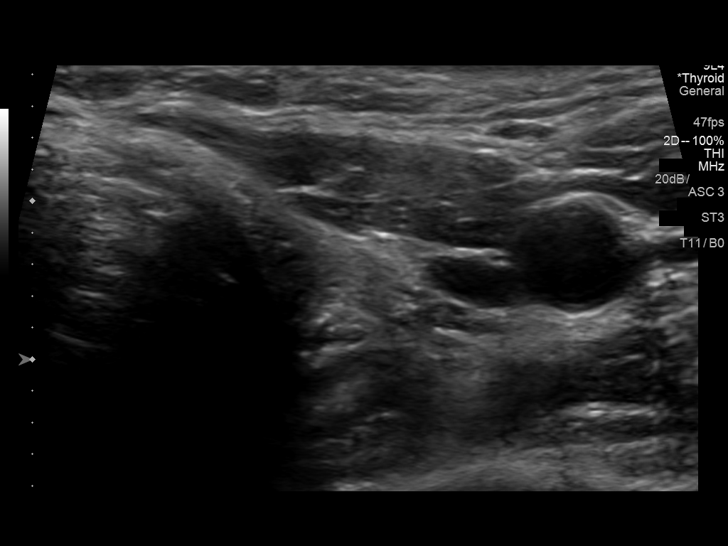
[im 30/33]
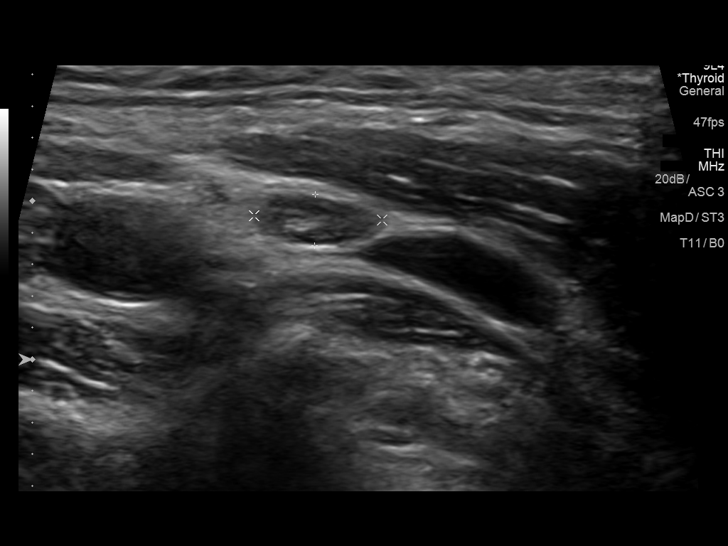
[im 33/33]
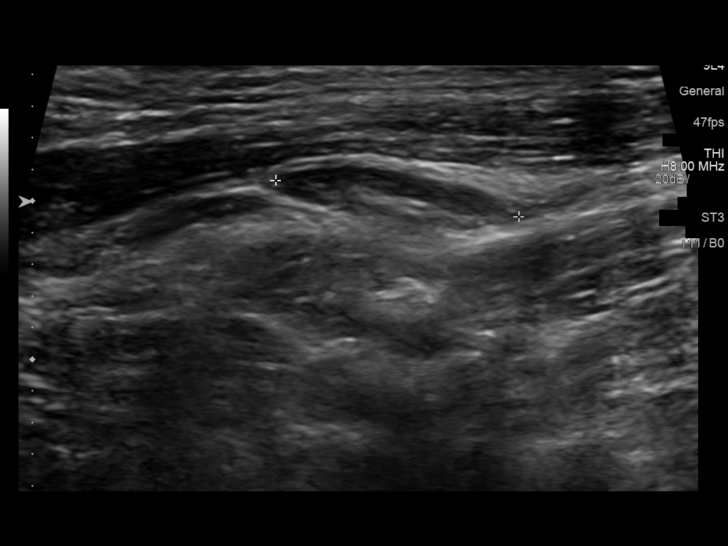

[14 of 25 positions shown; findings below may reference images not displayed]

FINDINGS: Parenchymal Echotexture: Moderately heterogenous

Isthmus: 0.1 cm thickness

Right lobe: 2.4 x 0.3 x 1.2 cm

Left lobe: 1.6 x 0.5 x 0.7 cm

_________________________________________________________

Estimated total number of nodules >/= 1 cm: 0

Number of spongiform nodules >/=  2 cm not described below (TR1): 0

Number of mixed cystic and solid nodules >/= 1.5 cm not described
below (TR2): 0

No discrete nodules are seen within the thyroid gland.

Bilateral cervical lymph nodes are noted none measuring greater than
3 mm short axis diameter.
IMPRESSION: 1. Small inhomogeneous thyroid without nodule or other focal lesion.

The above is in keeping with the ACR TI-RADS recommendations - [HOSPITAL] 4225;[DATE].

## 2017-05-14 DIAGNOSIS — J019 Acute sinusitis, unspecified: Secondary | ICD-10-CM | POA: Diagnosis not present

## 2017-06-04 DIAGNOSIS — J0100 Acute maxillary sinusitis, unspecified: Secondary | ICD-10-CM | POA: Diagnosis not present

## 2017-06-24 DIAGNOSIS — Z0000 Encounter for general adult medical examination without abnormal findings: Secondary | ICD-10-CM | POA: Diagnosis not present

## 2017-07-08 DIAGNOSIS — Z125 Encounter for screening for malignant neoplasm of prostate: Secondary | ICD-10-CM | POA: Diagnosis not present

## 2017-07-08 DIAGNOSIS — E039 Hypothyroidism, unspecified: Secondary | ICD-10-CM | POA: Diagnosis not present

## 2017-07-08 DIAGNOSIS — Z0000 Encounter for general adult medical examination without abnormal findings: Secondary | ICD-10-CM | POA: Diagnosis not present

## 2017-08-11 DIAGNOSIS — S80861A Insect bite (nonvenomous), right lower leg, initial encounter: Secondary | ICD-10-CM | POA: Diagnosis not present

## 2017-08-11 DIAGNOSIS — L299 Pruritus, unspecified: Secondary | ICD-10-CM | POA: Diagnosis not present

## 2017-08-26 DIAGNOSIS — E039 Hypothyroidism, unspecified: Secondary | ICD-10-CM | POA: Diagnosis not present

## 2017-09-02 DIAGNOSIS — E291 Testicular hypofunction: Secondary | ICD-10-CM | POA: Diagnosis not present

## 2017-09-02 DIAGNOSIS — E039 Hypothyroidism, unspecified: Secondary | ICD-10-CM | POA: Diagnosis not present

## 2017-11-10 DIAGNOSIS — M5431 Sciatica, right side: Secondary | ICD-10-CM | POA: Diagnosis not present

## 2017-11-17 DIAGNOSIS — M5416 Radiculopathy, lumbar region: Secondary | ICD-10-CM | POA: Insufficient documentation

## 2019-07-27 DIAGNOSIS — M25559 Pain in unspecified hip: Secondary | ICD-10-CM | POA: Insufficient documentation

## 2020-02-25 ENCOUNTER — Other Ambulatory Visit: Payer: Self-pay

## 2020-02-25 ENCOUNTER — Other Ambulatory Visit: Payer: Self-pay | Admitting: Podiatry

## 2020-02-25 ENCOUNTER — Ambulatory Visit: Payer: 59 | Admitting: Podiatry

## 2020-02-25 ENCOUNTER — Ambulatory Visit (INDEPENDENT_AMBULATORY_CARE_PROVIDER_SITE_OTHER): Payer: 59

## 2020-02-25 DIAGNOSIS — M722 Plantar fascial fibromatosis: Secondary | ICD-10-CM

## 2020-02-25 DIAGNOSIS — M79672 Pain in left foot: Secondary | ICD-10-CM

## 2020-02-29 ENCOUNTER — Encounter: Payer: Self-pay | Admitting: Podiatry

## 2020-02-29 NOTE — Progress Notes (Addendum)
Subjective:  Patient ID: Bradley Clark, male    DOB: Nov 08, 1970,  MRN: [id number]  Chief Complaint  Patient presents with  . Foot Pain    Left foot heel pain. PT stated that it has been going on for several months and he has tried several different options but nothing has helped, He stated that the pain is a lot worse in the morning and after he has been on his foot for a while.    50 y.o. male presents with the above complaint.  Patient presents with above complaint.  Patient states that he is experiencing post static dyskinesia type of symptoms with pain worse in the morning.  Patient states that it feels like sharp pain/bruising pain to the heel.  He has been going on for few months.  He denies any treatment options he has not seen anyone else prior to seeing me.  He would like to discuss treatment options as well.   Review of Systems: Negative except as noted in the HPI. Denies N/V/F/Ch.  Past Medical History:  Diagnosis Date  . GERD (gastroesophageal reflux disease)   . Hypothyroidism   . Thyroid disease     Current Outpatient Medications:  .  esomeprazole (NEXIUM) 20 MG capsule, Take 20 mg by mouth daily at 12 noon., Disp: , Rfl:  .  HYDROcodone-acetaminophen (NORCO/VICODIN) 5-325 MG per tablet, Take 1 tablet by mouth every 4 (four) hours as needed., Disp: 40 tablet, Rfl: 0 .  levothyroxine (SYNTHROID, LEVOTHROID) 175 MCG tablet, Take 175 mcg by mouth daily before breakfast., Disp: , Rfl:  .  Multiple Vitamins-Minerals (MULTIVITAMIN WITH MINERALS) tablet, Take 1 tablet by mouth daily., Disp: , Rfl:   Social History   Tobacco Use  Smoking Status Former Smoker  . Types: Cigarettes  . Quit date: 12/14/1997  . Years since quitting: 22.2  Smokeless Tobacco Former User    No Known Allergies Objective:  There were no vitals filed for this visit. There is no height or weight on file to calculate BMI. Constitutional Well developed. Well nourished.  Vascular Dorsalis pedis  pulses palpable bilaterally. Posterior tibial pulses palpable bilaterally. Capillary refill normal to all digits.  No cyanosis or clubbing noted. Pedal hair growth normal.  Neurologic Normal speech. Oriented to person, place, and time. Epicritic sensation to light touch grossly present bilaterally.  Dermatologic Nails well groomed and normal in appearance. No open wounds. No skin lesions.  Orthopedic: Normal joint ROM without pain or crepitus bilaterally. No visible deformities. Tender to palpation at the calcaneal tuber left. No pain with calcaneal squeeze left. Ankle ROM diminished range of motion left. Silfverskiold Test: positive left.   Radiographs: Taken and reviewed. No acute fractures or dislocations. No evidence of stress fracture.  Plantar heel spur present . Posterior heel spur present.   Assessment:   1. Plantar fasciitis, left    Plan:  Patient was evaluated and treated and all questions answered.  Plantar Fasciitis, left - XR reviewed as above.  - Educated on icing and stretching. Instructions given.  - Injection delivered to the plantar fascia as below. - DME: Plantar Fascial Brace - Pharmacologic management: None  Pes planovalgus -I explained patient the etiology of pes planovalgus measurement options were discussed.  Given MR pain is having I believe patient will benefit from custom-made orthotics are controlled-motion support the arch of the foot and take the stress away from the plantar fascia.  Patient agrees with the plan like to proceed with getting orthotics. -He was scheduled  see rec for custom-made orthotics  Procedure: Injection Tendon/Ligament Location: Left plantar fascia at the glabrous junction; medial approach. Skin Prep: alcohol Injectate: 0.5 cc 0.5% marcaine plain, 0.5 cc of 1% Lidocaine, 0.5 cc kenalog 10. Disposition: Patient tolerated procedure well. Injection site dressed with a band-aid.  No follow-ups on file.

## 2020-03-08 ENCOUNTER — Ambulatory Visit (INDEPENDENT_AMBULATORY_CARE_PROVIDER_SITE_OTHER): Payer: 59 | Admitting: Orthotics

## 2020-03-08 ENCOUNTER — Other Ambulatory Visit: Payer: Self-pay

## 2020-03-08 DIAGNOSIS — M722 Plantar fascial fibromatosis: Secondary | ICD-10-CM

## 2020-03-08 NOTE — Progress Notes (Signed)

## 2020-03-24 ENCOUNTER — Ambulatory Visit: Payer: 59 | Admitting: Podiatry

## 2020-03-24 ENCOUNTER — Other Ambulatory Visit: Payer: Self-pay

## 2020-03-24 DIAGNOSIS — M722 Plantar fascial fibromatosis: Secondary | ICD-10-CM | POA: Diagnosis not present

## 2020-03-28 ENCOUNTER — Encounter: Payer: Self-pay | Admitting: Podiatry

## 2020-03-28 NOTE — Progress Notes (Signed)
  Subjective:  Patient ID: Bradley Clark, male    DOB: Jul 16, 1970,  MRN: [id number]  Chief Complaint  Patient presents with  . Plantar Fasciitis    PT stated that there has been no change he is still having pain     50 y.o. male presents with the above complaint.  Patient presents with follow-up of left plantar fasciitis.  Patient states the pain is about the same.  The injection helped a little bit but not that much.  He would like to discuss next treatment options.  He says the brace helps.  He obtained the orthotics as well.  He denies any other acute complaints.   Review of Systems: Negative except as noted in the HPI. Denies N/V/F/Ch.  Past Medical History:  Diagnosis Date  . GERD (gastroesophageal reflux disease)   . Hypothyroidism   . Thyroid disease     Current Outpatient Medications:  .  esomeprazole (NEXIUM) 20 MG capsule, Take 20 mg by mouth daily at 12 noon., Disp: , Rfl:  .  HYDROcodone-acetaminophen (NORCO/VICODIN) 5-325 MG per tablet, Take 1 tablet by mouth every 4 (four) hours as needed., Disp: 40 tablet, Rfl: 0 .  levothyroxine (SYNTHROID, LEVOTHROID) 175 MCG tablet, Take 175 mcg by mouth daily before breakfast., Disp: , Rfl:  .  Multiple Vitamins-Minerals (MULTIVITAMIN WITH MINERALS) tablet, Take 1 tablet by mouth daily., Disp: , Rfl:   Social History   Tobacco Use  Smoking Status Former Smoker  . Types: Cigarettes  . Quit date: 12/14/1997  . Years since quitting: 22.3  Smokeless Tobacco Former User    No Known Allergies Objective:  There were no vitals filed for this visit. There is no height or weight on file to calculate BMI. Constitutional Well developed. Well nourished.  Vascular Dorsalis pedis pulses palpable bilaterally. Posterior tibial pulses palpable bilaterally. Capillary refill normal to all digits.  No cyanosis or clubbing noted. Pedal hair growth normal.  Neurologic Normal speech. Oriented to person, place, and time. Epicritic sensation  to light touch grossly present bilaterally.  Dermatologic Nails well groomed and normal in appearance. No open wounds. No skin lesions.  Orthopedic: Normal joint ROM without pain or crepitus bilaterally. No visible deformities. Tender to palpation at the calcaneal tuber left. No pain with calcaneal squeeze left. Ankle ROM diminished range of motion left. Silfverskiold Test: Negative equinus   Radiographs: Taken and reviewed. No acute fractures or dislocations. No evidence of stress fracture.  Plantar heel spur present . Posterior heel spur present.   Assessment:   1. Plantar fasciitis, left    Plan:  Patient was evaluated and treated and all questions answered.  Plantar Fasciitis, left - XR reviewed as above.  - Educated on icing and stretching. Instructions given.  -No further injection delivered to the plantar fascia as below. - DME: Cam boot immobilization - Pharmacologic management: None -Prescription for physical therapy was also dispensed.  Pes planovalgus -I explained patient the etiology of pes planovalgus measurement options were discussed.  Given MR pain is having I believe patient will benefit from custom-made orthotics are controlled-motion support the arch of the foot and take the stress away from the plantar fascia.  Patient agrees with the plan like to proceed with getting orthotics. -He has obtained orthotics and is functioning well in them.    No follow-ups on file.

## 2020-03-30 ENCOUNTER — Ambulatory Visit (INDEPENDENT_AMBULATORY_CARE_PROVIDER_SITE_OTHER): Payer: 59 | Admitting: Podiatry

## 2020-03-30 ENCOUNTER — Other Ambulatory Visit: Payer: Self-pay

## 2020-03-30 DIAGNOSIS — M722 Plantar fascial fibromatosis: Secondary | ICD-10-CM

## 2020-03-30 NOTE — Patient Instructions (Signed)

## 2020-03-30 NOTE — Progress Notes (Signed)
Patient presents today for orthotic pick up. Patient voices no new complaints.  Orthotics were fitted to patient's feet. No discomfort and no rubbing. Patient satisfied with the orthotics.  Orthotics were dispensed to patient with instructions for break in wear and to call the office with any concerns or questions. 

## 2020-04-14 ENCOUNTER — Ambulatory Visit: Payer: 59 | Admitting: Podiatry

## 2021-02-14 ENCOUNTER — Other Ambulatory Visit: Payer: Self-pay | Admitting: Nurse Practitioner

## 2021-02-14 ENCOUNTER — Ambulatory Visit
Admission: RE | Admit: 2021-02-14 | Discharge: 2021-02-14 | Disposition: A | Discharge status: Home / Self Care | Payer: No Typology Code available for payment source | Source: Ambulatory Visit | Attending: Nurse Practitioner | Admitting: Nurse Practitioner

## 2021-02-14 DIAGNOSIS — Z0289 Encounter for other administrative examinations: Secondary | ICD-10-CM

## 2021-07-09 LAB — COLOGUARD: COLOGUARD: NEGATIVE

## 2024-01-02 ENCOUNTER — Other Ambulatory Visit (HOSPITAL_BASED_OUTPATIENT_CLINIC_OR_DEPARTMENT_OTHER): Payer: Self-pay

## 2024-01-02 DIAGNOSIS — E7800 Pure hypercholesterolemia, unspecified: Secondary | ICD-10-CM

## 2024-01-13 ENCOUNTER — Ambulatory Visit (HOSPITAL_COMMUNITY)
Admission: RE | Admit: 2024-01-13 | Discharge: 2024-01-13 | Disposition: A | Discharge status: Home / Self Care | Payer: Self-pay | Source: Ambulatory Visit

## 2024-01-13 DIAGNOSIS — E7800 Pure hypercholesterolemia, unspecified: Secondary | ICD-10-CM | POA: Insufficient documentation
